# Patient Record
Sex: Male | Born: 1985 | Race: White | Hispanic: No | Marital: Single
Health system: Southern US, Community
[De-identification: ages and names within clinical notes are randomized; demographics above are authoritative.]

## PROBLEM LIST (undated history)

## (undated) HISTORY — PX: HAND SURGERY: SHX662

---

## 2015-09-24 ENCOUNTER — Emergency Department (HOSPITAL_COMMUNITY)
Admission: EM | Admit: 2015-09-24 | Discharge: 2015-09-24 | Disposition: A | Discharge status: Home / Self Care | Payer: Self-pay | Attending: Emergency Medicine | Admitting: Emergency Medicine

## 2015-09-24 ENCOUNTER — Emergency Department (HOSPITAL_COMMUNITY): Payer: Self-pay

## 2015-09-24 ENCOUNTER — Encounter (HOSPITAL_COMMUNITY): Payer: Self-pay | Admitting: Emergency Medicine

## 2015-09-24 DIAGNOSIS — Y998 Other external cause status: Secondary | ICD-10-CM | POA: Insufficient documentation

## 2015-09-24 DIAGNOSIS — Z8781 Personal history of (healed) traumatic fracture: Secondary | ICD-10-CM | POA: Insufficient documentation

## 2015-09-24 DIAGNOSIS — S8992XA Unspecified injury of left lower leg, initial encounter: Secondary | ICD-10-CM | POA: Insufficient documentation

## 2015-09-24 DIAGNOSIS — Y9339 Activity, other involving climbing, rappelling and jumping off: Secondary | ICD-10-CM | POA: Insufficient documentation

## 2015-09-24 DIAGNOSIS — X501XXA Overexertion from prolonged static or awkward postures, initial encounter: Secondary | ICD-10-CM | POA: Insufficient documentation

## 2015-09-24 DIAGNOSIS — S93402A Sprain of unspecified ligament of left ankle, initial encounter: Secondary | ICD-10-CM | POA: Insufficient documentation

## 2015-09-24 DIAGNOSIS — Y9289 Other specified places as the place of occurrence of the external cause: Secondary | ICD-10-CM | POA: Insufficient documentation

## 2015-09-24 MED ORDER — IBUPROFEN 800 MG PO TABS
800.0000 mg | ORAL_TABLET | Freq: Once | ORAL | Status: AC
  Administered 2015-09-24: 800 mg via ORAL
  Filled 2015-09-24: qty 1

## 2015-09-24 MED ORDER — IBUPROFEN 800 MG PO TABS: 800.0000 mg | ORAL_TABLET | Freq: Once | ORAL | Status: AC

## 2015-09-24 NOTE — ED Notes (Signed)
Patient was alert, oriented and stable upon discharge. RN went over AVS and patient had no further questions.  

## 2015-09-24 NOTE — ED Notes (Signed)
Pt states that two days ago he jumped over something and twisted his L ankle. Ankle appears to be swollen but patient states that his knee hurts as well. Alert and oriented.

## 2015-09-24 NOTE — Discharge Instructions (Signed)
There does not appear to be an emergent cause for your ankle pain at this time. He likely sprained it. Your x-ray is negative for any acute broken bones. Please take your Motrin as prescribed for discomfort. Where your brace while active to help with inflammation. Follow-up with your doctor as needed. Return to ED for any new or worsening symptoms as we discussed  Ankle Sprain An ankle sprain is an injury to the strong, fibrous tissues (ligaments) that hold the bones of your ankle joint together.  CAUSES An ankle sprain is usually caused by a fall or by twisting your ankle. Ankle sprains most commonly occur when you step on the outer edge of your foot, and your ankle turns inward. People who participate in sports are more prone to these types of injuries.  SYMPTOMS   Pain in your ankle. The pain may be present at rest or only when you are trying to stand or walk.  Swelling.  Bruising. Bruising may develop immediately or within 1 to 2 days after your injury.  Difficulty standing or walking, particularly when turning corners or changing directions. DIAGNOSIS  Your caregiver will ask you details about your injury and perform a physical exam of your ankle to determine if you have an ankle sprain. During the physical exam, your caregiver will press on and apply pressure to specific areas of your foot and ankle. Your caregiver will try to move your ankle in certain ways. An X-ray exam may be done to be sure a bone was not broken or a ligament did not separate from one of the bones in your ankle (avulsion fracture).  TREATMENT  Certain types of braces can help stabilize your ankle. Your caregiver can make a recommendation for this. Your caregiver may recommend the use of medicine for pain. If your sprain is severe, your caregiver may refer you to a surgeon who helps to restore function to parts of your skeletal system (orthopedist) or a physical therapist. HOME CARE INSTRUCTIONS   Apply ice to your  injury for 1-2 days or as directed by your caregiver. Applying ice helps to reduce inflammation and pain.  Put ice in a plastic bag.  Place a towel between your skin and the bag.  Leave the ice on for 15-20 minutes at a time, every 2 hours while you are awake.  Only take over-the-counter or prescription medicines for pain, discomfort, or fever as directed by your caregiver.  Elevate your injured ankle above the level of your heart as much as possible for 2-3 days.  If your caregiver recommends crutches, use them as instructed. Gradually put weight on the affected ankle. Continue to use crutches or a cane until you can walk without feeling pain in your ankle.  If you have a plaster splint, wear the splint as directed by your caregiver. Do not rest it on anything harder than a pillow for the first 24 hours. Do not put weight on it. Do not get it wet. You may take it off to take a shower or bath.  You may have been given an elastic bandage to wear around your ankle to provide support. If the elastic bandage is too tight (you have numbness or tingling in your foot or your foot becomes cold and blue), adjust the bandage to make it comfortable.  If you have an air splint, you may blow more air into it or let air out to make it more comfortable. You may take your splint off at night and  before taking a shower or bath. Wiggle your toes in the splint several times per day to decrease swelling. SEEK MEDICAL CARE IF:   You have rapidly increasing bruising or swelling.  Your toes feel extremely cold or you lose feeling in your foot.  Your pain is not relieved with medicine. SEEK IMMEDIATE MEDICAL CARE IF:  Your toes are numb or blue.  You have severe pain that is increasing. MAKE SURE YOU:   Understand these instructions.  Will watch your condition.  Will get help right away if you are not doing well or get worse.   This information is not intended to replace advice given to you by your  health care provider. Make sure you discuss any questions you have with your health care provider.   Document Released: 09/06/2005 Document Revised: 09/27/2014 Document Reviewed: 09/18/2011 Elsevier Interactive Patient Education [employer]2016 [employer]

## 2015-09-24 NOTE — ED Provider Notes (Signed)
CSN: [id number]     Arrival date & time 09/24/15  1548 History  By signing my name below, I, Doreatha Martin, attest that this documentation has been prepared under the direction and in the presence of  [employer], PA-C. Electronically Signed: Doreatha Martin, ED Scribe. 09/24/2015. 5:57 PM.    Chief Complaint  Patient presents with  . Ankle Pain  . Leg Pain   The history is provided by the patient. No language interpreter was used.    HPI Comments: Ralph Rice is a 30 y.o. male otherwise healthy who presents to the Emergency Department complaining of moderate, throbbing left ankle pain and left knee pain onset 2 days ago after jumping over something and anteverting the ankle when he landed. No LOC or head injury. Pt is ambulatory without difficulty. He notes that pain is worsened with weight bearing and movement. Pt denies taking OTC medications at home to improve symptoms. Pt notes he fractured the ankle as a young child. He denies additional injuries.   No past medical history on file. Past Surgical History  Procedure Laterality Date  . Hand surgery Right    No family history on file. Social History  Substance Use Topics  . Smoking status: Not on file  . Smokeless tobacco: Not on file  . Alcohol Use: Not on file    Review of Systems A 10 point review of systems was completed and was negative except for pertinent positives and negatives as mentioned in the history of present illness.   Allergies  Review of patient's allergies indicates no known allergies.  Home Medications   Prior to Admission medications   Medication Sig Start Date End Date Taking? Authorizing Provider  ibuprofen (ADVIL,MOTRIN) 800 MG tablet Take 1 tablet (800 mg total) by mouth once. 09/24/15   Joycie Peek, PA-C   BP 125/73 mmHg  Pulse 104  Temp(Src) 98.2 F (36.8 C)  Resp 14  Ht 5' 9.75" (1.772 m)  Wt 170 lb (77.111 kg)  BMI 24.56 kg/m2  SpO2 100% Physical Exam  Constitutional: He is oriented to  person, place, and time. He appears well-developed and well-nourished.  Awake, alert, nontoxic appearance.    HENT:  Head: Normocephalic and atraumatic.  Eyes: Conjunctivae and EOM are normal. Pupils are equal, round, and reactive to light.  Neck: Normal range of motion. Neck supple.  Cardiovascular: Normal rate, regular rhythm and normal heart sounds.  Exam reveals no gallop and no friction rub.   No murmur heard. Heart sounds normal. RRR.    Pulmonary/Chest: Effort normal and breath sounds normal. No respiratory distress. He has no wheezes. He has no rales.  Lungs CTA bilaterally.   Abdominal: He exhibits no distension.  Musculoskeletal: Normal range of motion.  Full active ROM of the left ankle and knee. NVI.   Neurological: He is alert and oriented to person, place, and time.  Sensation intact to light touch. Motor function intact and equal bilaterally. Gait baseline.   Skin: Skin is warm and dry.  Psychiatric: He has a normal mood and affect. His behavior is normal.  Nursing note and vitals reviewed.   ED Course  Procedures (including critical care time) DIAGNOSTIC STUDIES: Oxygen Saturation is 100% on RA, normal by my interpretation.    COORDINATION OF CARE: 5:34 PM Discussed treatment plan with pt at bedside which includes symptomatic treatment and pt agreed to plan.  5:57 PM  SPLINT APPLICATION Authorized by: Joycie Peek, PA-C  Consent: Verbal consent obtained. Risks and benefits: risks,  benefits and alternatives were discussed Consent given by: patient Splint applied by: orthopedic technician Location details: Left ankle  Splint type: ASO  Supplies used: ASO  Post-procedure: The splinted body part was neurovascularly unchanged following the procedure. Patient tolerance: Patient tolerated the procedure well with no immediate complications.   Imaging Review Dg Ankle Complete Left  09/24/2015  CLINICAL DATA:  Pain after twisting injury 2 days prior EXAM: LEFT  ANKLE COMPLETE - 3+ VIEW COMPARISON:  None. FINDINGS: Frontal, oblique, and lateral views were obtained. There is soft tissue swelling laterally. There is evidence of an avulsion in the lateral malleolar region of uncertain age. The avulsed focus appears well corticated. There is a well corticated focus located in the medial malleolar region posteriorly which appears old. There is no other evidence suggesting fracture. No joint effusion. The ankle mortise appears intact. No erosive change. IMPRESSION: Old avulsion medial malleolus. Probable old avulsion lateral malleolus, although there is soft tissue swelling in this area. No other evidence of fracture. Ankle mortise appears intact. Electronically Signed   By: Bretta BangWilliam  Woodruff III M.D.   On: 09/24/2015 17:15   Dg Knee Complete 4 Views Left  09/24/2015  CLINICAL DATA:  Left knee pain after injury EXAM: LEFT KNEE - COMPLETE 4+ VIEW COMPARISON:  None. FINDINGS: There is no evidence of fracture, dislocation, or joint effusion. There is no evidence of arthropathy or other focal bone abnormality. Soft tissues are unremarkable. IMPRESSION: Negative. Electronically Signed   By: Delbert PhenixJason A Poff M.D.   On: 09/24/2015 17:14   I have personally reviewed and evaluated these images as part of my medical decision-making.   MDM   Final diagnoses:  Left ankle sprain, initial encounter   Filed Vitals:   09/24/15 1616  BP: 125/73  Pulse: 104  Temp: 98.2 F (36.8 C)  Resp: 14    Meds given in ED:  Medications  ibuprofen (ADVIL,MOTRIN) tablet 800 mg (800 mg Oral Given 09/24/15 1738)    New Prescriptions   IBUPROFEN (ADVIL,MOTRIN) 800 MG TABLET    Take 1 tablet (800 mg total) by mouth once.     Patient X-Ray negative for obvious fracture or dislocation. Mild diffuse left ankle swelling, remains neurovascularly intact. No tachycardia on my exam, heart rate in 90s. Pt advised to follow up with PCP. Patient given brace while in ED, conservative therapy  recommended and discussed. Patient will be discharged home & is agreeable with above plan. Returns precautions discussed. Pt appears safe for discharge.  I personally performed the services described in this documentation, which was scribed in my presence. The recorded information has been reviewed and is accurate.   Joycie PeekBenjamin Glenice Ciccone, PA-C 09/24/15 1821

## 2015-10-12 NOTE — ED Provider Notes (Signed)
Medical screening examination/treatment/procedure(s) were performed by non-physician practitioner and as supervising physician I was immediately available for consultation/collaboration.   EKG Interpretation None       Lorre Nick, MD 10/12/15 [phone]

## 2017-08-23 IMAGING — CR DG KNEE COMPLETE 4+V*L*
4 series · 4 of 4 positions shown · non-contrast
Comparison: None.

CLINICAL DATA: Left knee pain after injury

EXAM:
LEFT KNEE - COMPLETE 4+ VIEW

[t knee ap left]
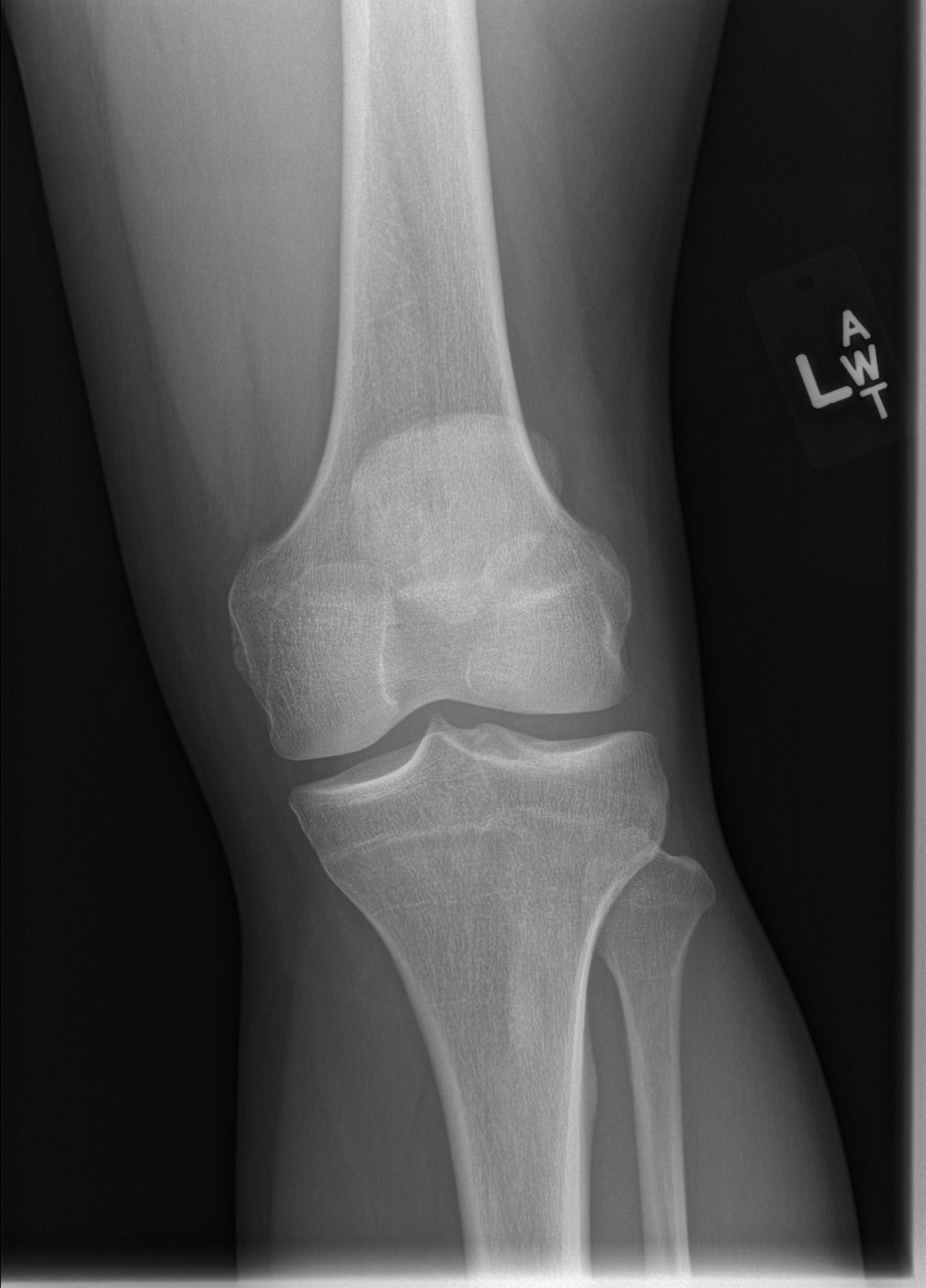

[t knee obl left (1 of 2)]
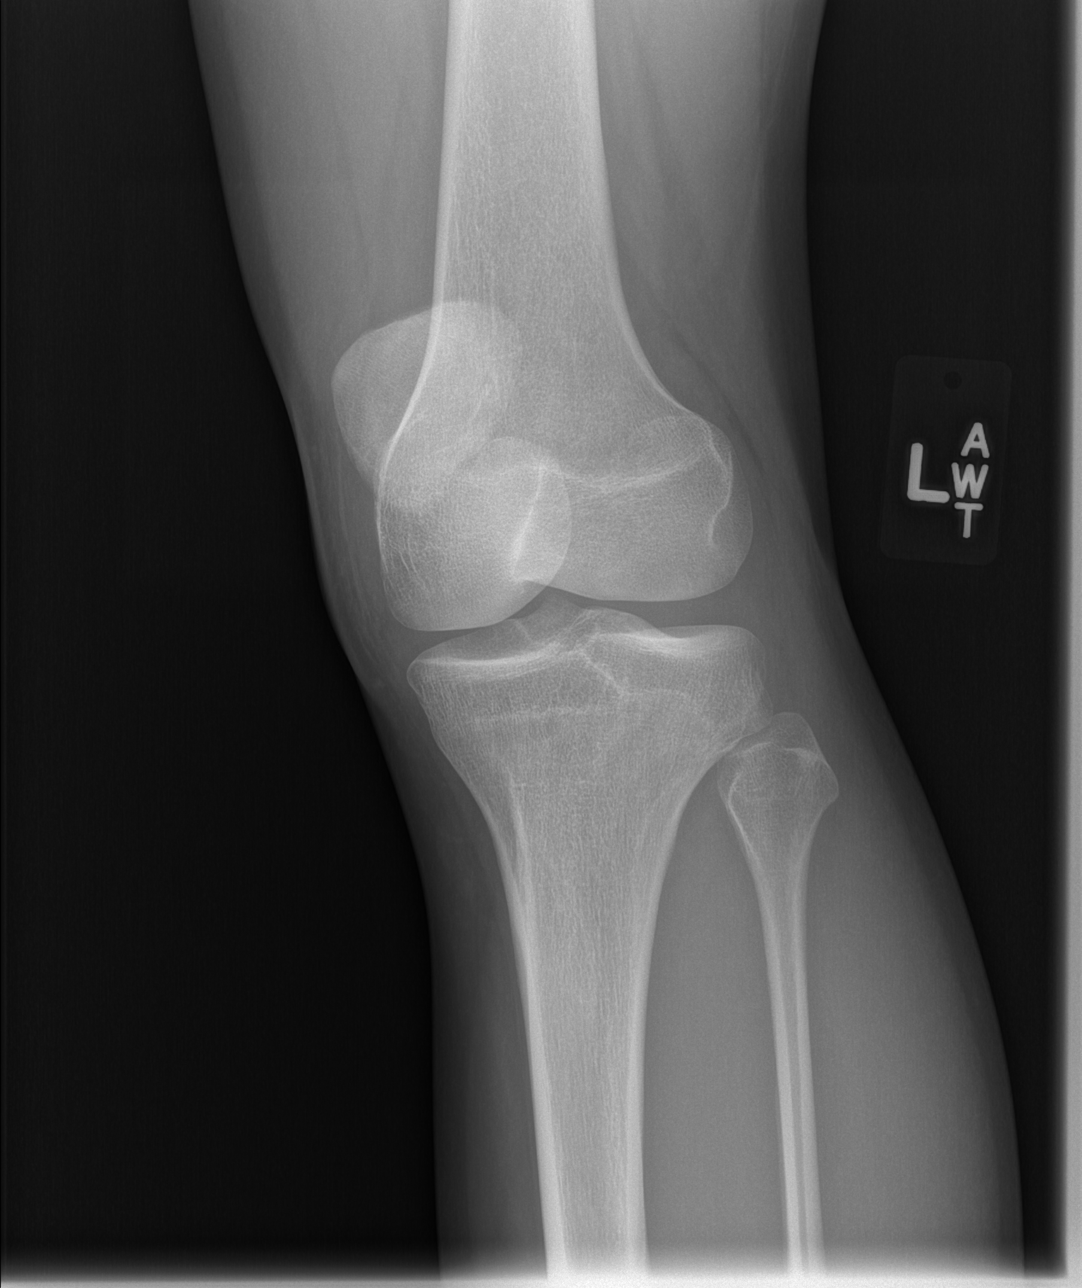

[t knee obl left (2 of 2)]
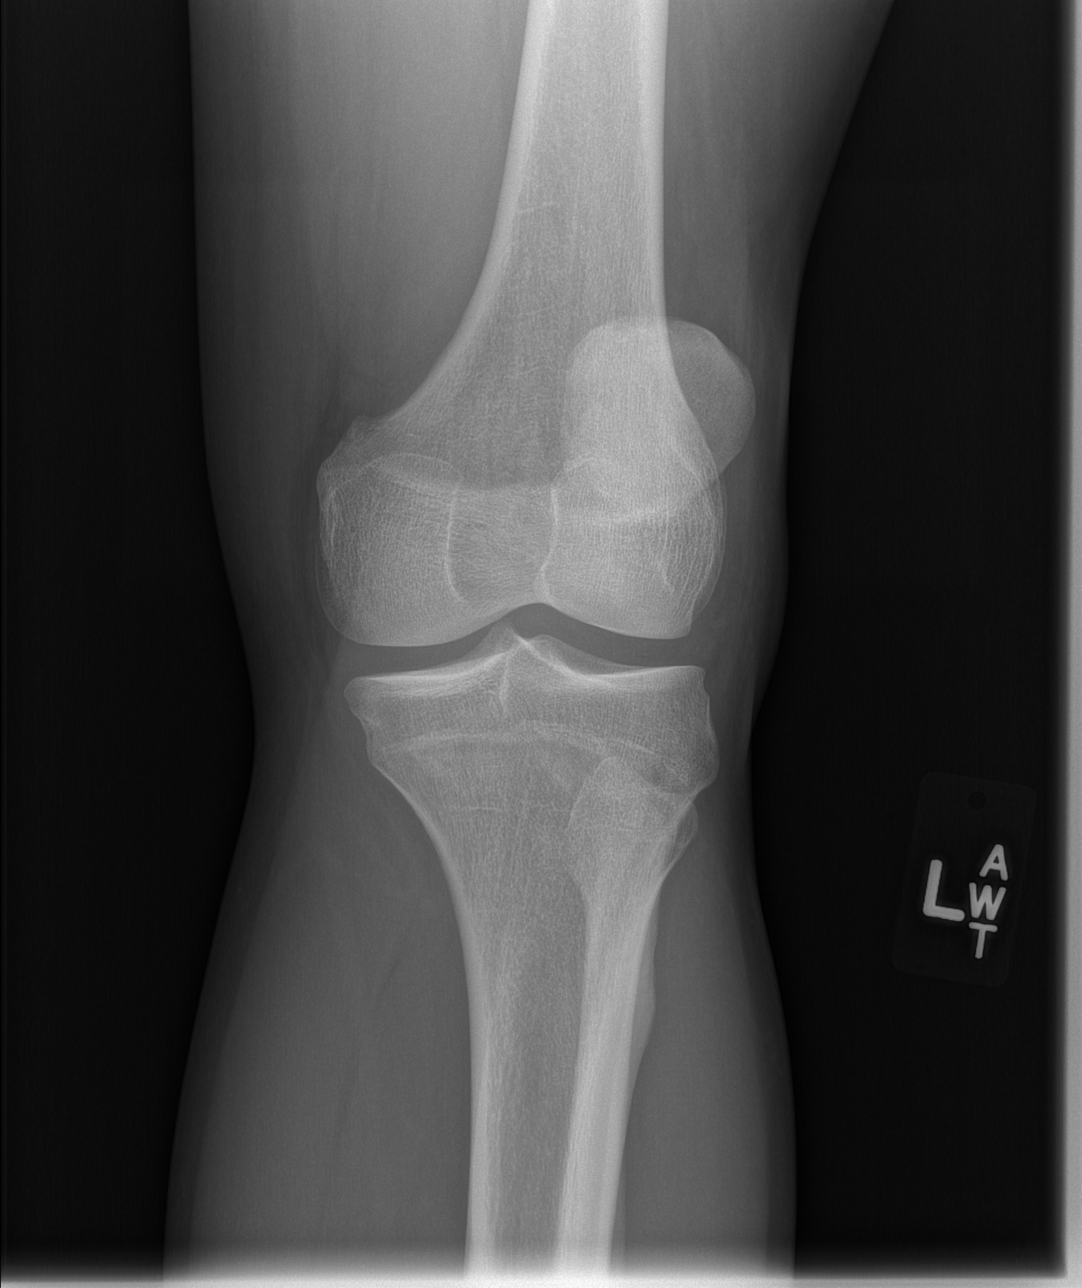

[t knee lat left]
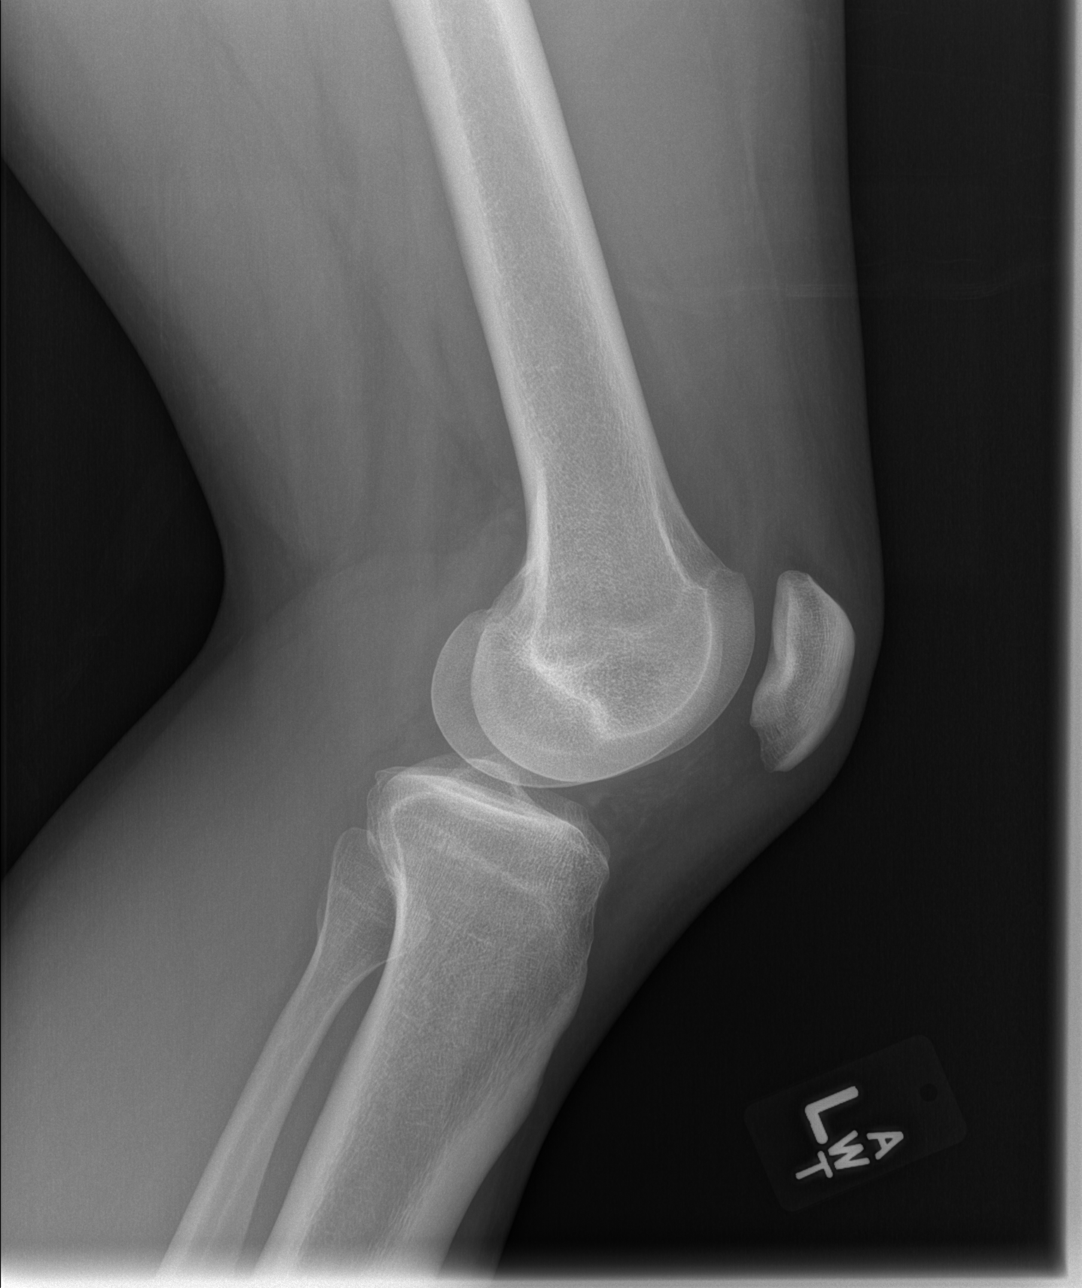

[4 of 4 positions shown; findings below may reference images not displayed]

FINDINGS: There is no evidence of fracture, dislocation, or joint effusion.
There is no evidence of arthropathy or other focal bone abnormality.
Soft tissues are unremarkable.
IMPRESSION: Negative.

## 2017-08-23 IMAGING — CR DG ANKLE COMPLETE 3+V*L*
3 series · 3 of 3 positions shown · non-contrast
Comparison: None.

CLINICAL DATA: Pain after twisting injury 2 days prior

EXAM:
LEFT ANKLE COMPLETE - 3+ VIEW

[x ankle ap left]
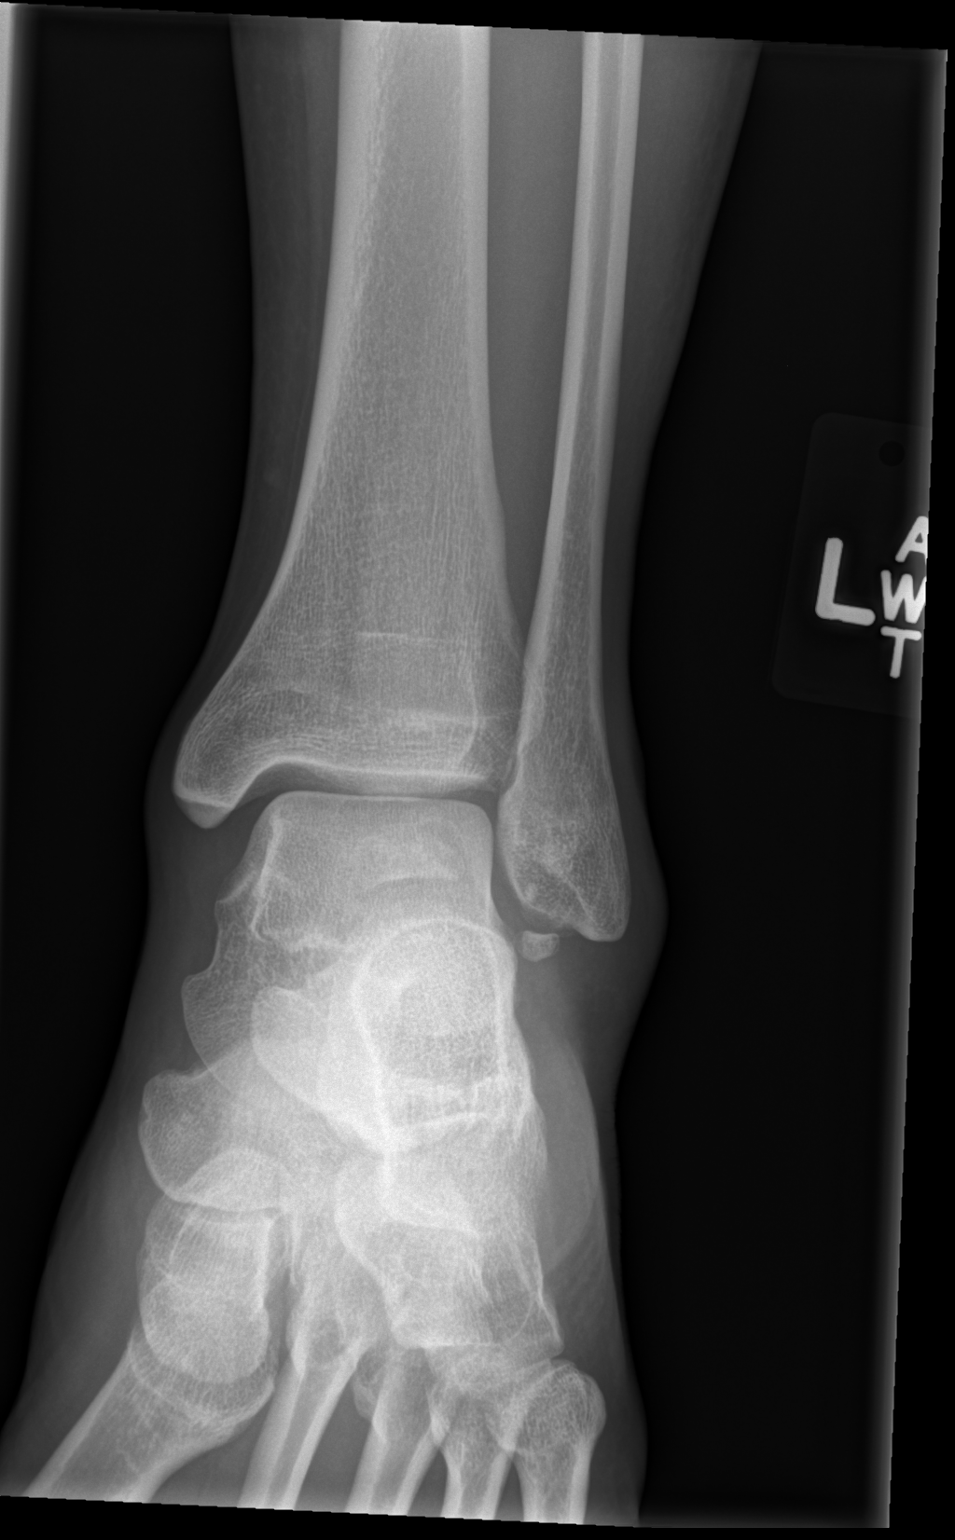

[x ankle obl left]
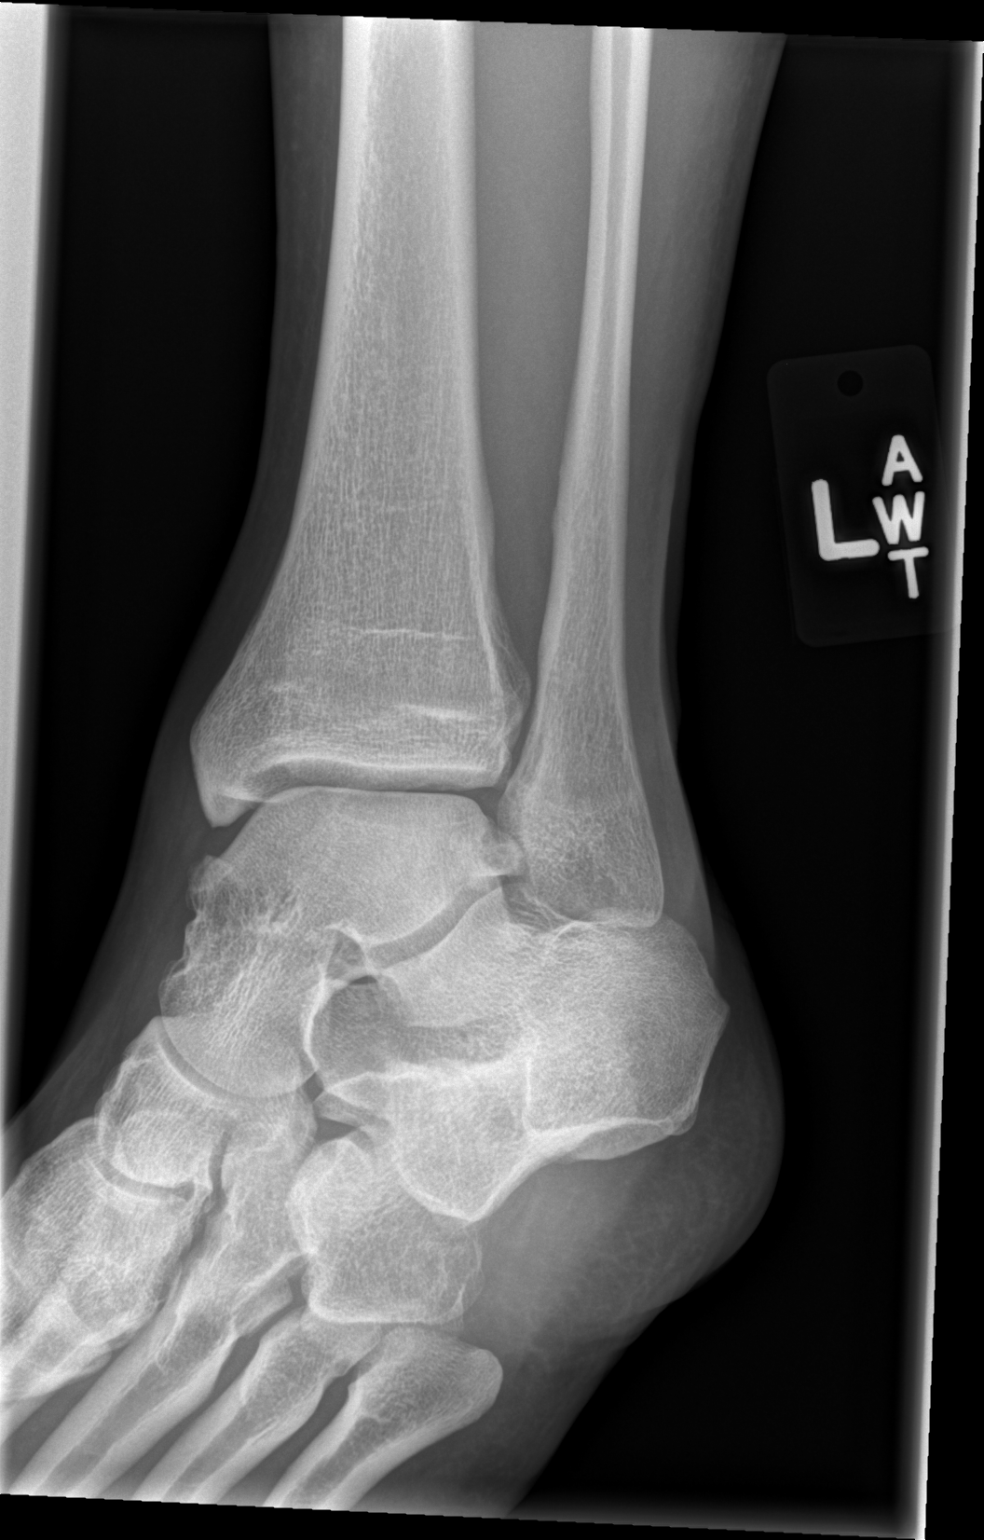

[x ankle lat left]
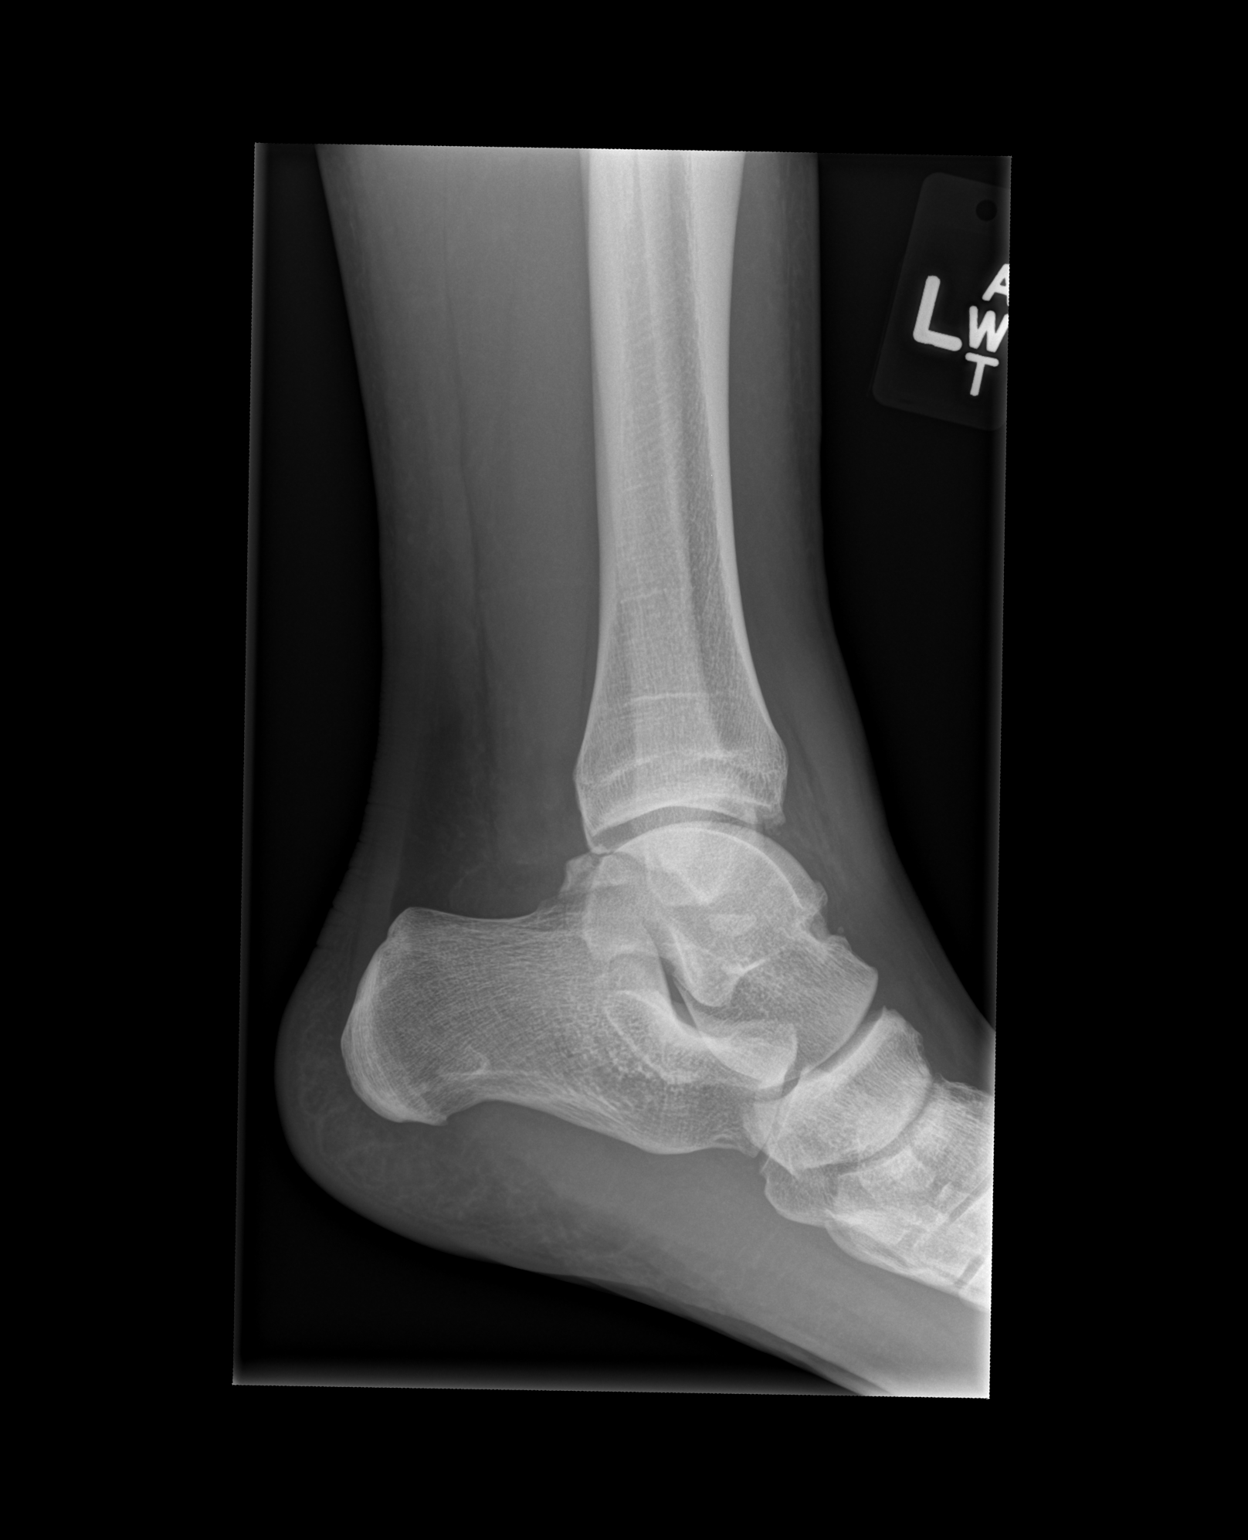

[3 of 3 positions shown; findings below may reference images not displayed]

FINDINGS: Frontal, oblique, and lateral views were obtained. There is soft
tissue swelling laterally. There is evidence of an avulsion in the
lateral malleolar region of uncertain age. The avulsed focus appears
well corticated. There is a well corticated focus located in the
medial malleolar region posteriorly which appears old. There is no
other evidence suggesting fracture. No joint effusion. The ankle
mortise appears intact. No erosive change.
IMPRESSION: Old avulsion medial malleolus. Probable old avulsion lateral
malleolus, although there is soft tissue swelling in this area. No
other evidence of fracture. Ankle mortise appears intact.
